# Patient Record
Sex: Female | Born: 1960 | Race: Black or African American | Hispanic: No | Smoking: Never smoker
Health system: Southern US, Community
[De-identification: ages and names within clinical notes are randomized; demographics above are authoritative.]

## PROBLEM LIST (undated history)

## (undated) DIAGNOSIS — D571 Sickle-cell disease without crisis: Secondary | ICD-10-CM

## (undated) DIAGNOSIS — J45909 Unspecified asthma, uncomplicated: Secondary | ICD-10-CM

## (undated) DIAGNOSIS — F4310 Post-traumatic stress disorder, unspecified: Secondary | ICD-10-CM

## (undated) HISTORY — PX: MEDIAL PARTIAL KNEE REPLACEMENT: SHX5965

---

## 2016-09-08 ENCOUNTER — Emergency Department (HOSPITAL_COMMUNITY): Payer: Medicare Other

## 2016-09-08 ENCOUNTER — Emergency Department (HOSPITAL_COMMUNITY)
Admission: EM | Admit: 2016-09-08 | Discharge: 2016-09-08 | Disposition: A | Discharge status: Home / Self Care | Payer: Medicare Other | Attending: Emergency Medicine | Admitting: Emergency Medicine

## 2016-09-08 ENCOUNTER — Encounter (HOSPITAL_COMMUNITY): Payer: Self-pay | Admitting: Emergency Medicine

## 2016-09-08 DIAGNOSIS — M79661 Pain in right lower leg: Secondary | ICD-10-CM

## 2016-09-08 DIAGNOSIS — M25512 Pain in left shoulder: Secondary | ICD-10-CM

## 2016-09-08 DIAGNOSIS — J45909 Unspecified asthma, uncomplicated: Secondary | ICD-10-CM | POA: Insufficient documentation

## 2016-09-08 DIAGNOSIS — Y999 Unspecified external cause status: Secondary | ICD-10-CM | POA: Insufficient documentation

## 2016-09-08 DIAGNOSIS — M79604 Pain in right leg: Secondary | ICD-10-CM | POA: Insufficient documentation

## 2016-09-08 DIAGNOSIS — Y939 Activity, unspecified: Secondary | ICD-10-CM | POA: Diagnosis not present

## 2016-09-08 DIAGNOSIS — Y929 Unspecified place or not applicable: Secondary | ICD-10-CM | POA: Insufficient documentation

## 2016-09-08 HISTORY — DX: Sickle-cell disease without crisis: D57.1

## 2016-09-08 HISTORY — DX: Unspecified asthma, uncomplicated: J45.909

## 2016-09-08 HISTORY — DX: Post-traumatic stress disorder, unspecified: F43.10

## 2016-09-08 NOTE — ED Notes (Signed)
Pt taken to xray 

## 2016-09-08 NOTE — Discharge Instructions (Signed)
Please continue to ice, elevate, rest her left shoulder and right leg. You may take Tylenol and ibuprofen for pain and swelling. Wear the sling as needed for comfort. Please follow-up with the orthopedist as given a referral for. You also need a follow-up with her primary care doctor next week if your symptoms persist.
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
## 2016-09-19 ENCOUNTER — Ambulatory Visit (INDEPENDENT_AMBULATORY_CARE_PROVIDER_SITE_OTHER): Payer: Medicare Other | Admitting: Orthopaedic Surgery

## 2016-09-19 ENCOUNTER — Encounter (INDEPENDENT_AMBULATORY_CARE_PROVIDER_SITE_OTHER): Payer: Self-pay | Admitting: Orthopaedic Surgery

## 2016-09-19 VITALS — Ht 63.0 in | Wt 163.0 lb

## 2016-09-19 DIAGNOSIS — M25512 Pain in left shoulder: Secondary | ICD-10-CM

## 2016-09-19 MED ORDER — METHYLPREDNISOLONE ACETATE 40 MG/ML IJ SUSP
40.0000 mg | INTRAMUSCULAR | Status: AC | PRN
  Administered 2016-09-19: 40 mg via INTRA_ARTICULAR

## 2016-09-19 MED ORDER — LIDOCAINE HCL 1 % IJ SOLN
3.0000 mL | INTRAMUSCULAR | Status: AC | PRN
  Administered 2016-09-19: 3 mL
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
## 2016-10-18 ENCOUNTER — Ambulatory Visit (INDEPENDENT_AMBULATORY_CARE_PROVIDER_SITE_OTHER): Payer: Medicare Other | Admitting: Orthopaedic Surgery

## 2016-10-18 DIAGNOSIS — M25512 Pain in left shoulder: Secondary | ICD-10-CM
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
